# Patient Record
Sex: Female | Born: 1950
Health system: Southern US, Community
[De-identification: ages and names within clinical notes are randomized; demographics above are authoritative.]

## PROBLEM LIST (undated history)

## (undated) DIAGNOSIS — Z9889 Other specified postprocedural states: Secondary | ICD-10-CM

## (undated) DIAGNOSIS — M7662 Achilles tendinitis, left leg: Secondary | ICD-10-CM

## (undated) DIAGNOSIS — M797 Fibromyalgia: Secondary | ICD-10-CM

## (undated) DIAGNOSIS — E049 Nontoxic goiter, unspecified: Secondary | ICD-10-CM

## (undated) DIAGNOSIS — R112 Nausea with vomiting, unspecified: Secondary | ICD-10-CM

## (undated) HISTORY — PX: ABDOMINAL HYSTERECTOMY: SHX81

## (undated) HISTORY — PX: ACHILLES TENDON SURGERY: SHX542

---

## 1991-10-10 HISTORY — PX: BREAST EXCISIONAL BIOPSY: SUR124

## 2015-04-21 ENCOUNTER — Other Ambulatory Visit: Payer: Self-pay | Admitting: Orthopedic Surgery

## 2015-05-10 ENCOUNTER — Encounter (HOSPITAL_BASED_OUTPATIENT_CLINIC_OR_DEPARTMENT_OTHER): Payer: Self-pay | Admitting: *Deleted

## 2015-05-13 ENCOUNTER — Encounter (HOSPITAL_BASED_OUTPATIENT_CLINIC_OR_DEPARTMENT_OTHER)
Admission: RE | Disposition: A | Discharge status: Home / Self Care | Payer: Self-pay | Source: Ambulatory Visit | Attending: Orthopedic Surgery

## 2015-05-13 ENCOUNTER — Ambulatory Visit (HOSPITAL_BASED_OUTPATIENT_CLINIC_OR_DEPARTMENT_OTHER)
Admission: RE | Admit: 2015-05-13 | Discharge: 2015-05-13 | Disposition: A | Discharge status: Home / Self Care | Payer: Medicare Other | Source: Ambulatory Visit | Attending: Orthopedic Surgery | Admitting: Orthopedic Surgery

## 2015-05-13 ENCOUNTER — Ambulatory Visit (HOSPITAL_BASED_OUTPATIENT_CLINIC_OR_DEPARTMENT_OTHER): Payer: Medicare Other | Admitting: Certified Registered"

## 2015-05-13 ENCOUNTER — Encounter (HOSPITAL_BASED_OUTPATIENT_CLINIC_OR_DEPARTMENT_OTHER): Payer: Self-pay | Admitting: *Deleted

## 2015-05-13 DIAGNOSIS — M797 Fibromyalgia: Secondary | ICD-10-CM | POA: Diagnosis not present

## 2015-05-13 DIAGNOSIS — M7662 Achilles tendinitis, left leg: Secondary | ICD-10-CM | POA: Insufficient documentation

## 2015-05-13 DIAGNOSIS — Z87891 Personal history of nicotine dependence: Secondary | ICD-10-CM | POA: Insufficient documentation

## 2015-05-13 HISTORY — PX: GASTROCNEMIUS RECESSION: SHX863

## 2015-05-13 HISTORY — DX: Achilles tendinitis, left leg: M76.62

## 2015-05-13 HISTORY — DX: Nontoxic goiter, unspecified: E04.9

## 2015-05-13 HISTORY — DX: Nausea with vomiting, unspecified: R11.2

## 2015-05-13 HISTORY — DX: Other specified postprocedural states: Z98.890

## 2015-05-13 HISTORY — PX: EXCISION HAGLUND'S DEFORMITY WITH ACHILLES TENDON REPAIR: SHX5627

## 2015-05-13 HISTORY — DX: Fibromyalgia: M79.7

## 2015-05-13 LAB — POCT HEMOGLOBIN-HEMACUE: HEMOGLOBIN: 13.8 g/dL (ref 12.0–15.0)

## 2015-05-13 SURGERY — RECESSION, MUSCLE, GASTROCNEMIUS
Anesthesia: Regional | Site: Leg Lower | Laterality: Left

## 2015-05-13 MED ORDER — SUCCINYLCHOLINE CHLORIDE 20 MG/ML IJ SOLN
INTRAMUSCULAR | Status: AC
  Filled 2015-05-13: qty 1

## 2015-05-13 MED ORDER — FENTANYL CITRATE (PF) 100 MCG/2ML IJ SOLN
50.0000 ug | INTRAMUSCULAR | Status: DC | PRN
  Administered 2015-05-13: 100 ug via INTRAVENOUS

## 2015-05-13 MED ORDER — MIDAZOLAM HCL 2 MG/2ML IJ SOLN
INTRAMUSCULAR | Status: AC
  Filled 2015-05-13: qty 2

## 2015-05-13 MED ORDER — DEXAMETHASONE SODIUM PHOSPHATE 4 MG/ML IJ SOLN
INTRAMUSCULAR | Status: DC | PRN
  Administered 2015-05-13: 10 mg via INTRAVENOUS

## 2015-05-13 MED ORDER — SENNA 8.6 MG PO TABS: 2.0000 | ORAL_TABLET | Freq: Two times a day (BID) | ORAL | Status: DC

## 2015-05-13 MED ORDER — BUPIVACAINE-EPINEPHRINE (PF) 0.5% -1:200000 IJ SOLN
INTRAMUSCULAR | Status: DC | PRN
  Administered 2015-05-13 (×2): 10 mL via PERINEURAL

## 2015-05-13 MED ORDER — DOCUSATE SODIUM 100 MG PO CAPS: 100.0000 mg | ORAL_CAPSULE | Freq: Two times a day (BID) | ORAL | Status: DC

## 2015-05-13 MED ORDER — OXYCODONE HCL 5 MG PO TABS: 5.0000 mg | ORAL_TABLET | Freq: Once | ORAL | Status: DC | PRN

## 2015-05-13 MED ORDER — PROPOFOL 500 MG/50ML IV EMUL
INTRAVENOUS | Status: AC
  Filled 2015-05-13: qty 50

## 2015-05-13 MED ORDER — CEFAZOLIN SODIUM-DEXTROSE 2-3 GM-% IV SOLR
2.0000 g | INTRAVENOUS | Status: AC
  Administered 2015-05-13: 2 g via INTRAVENOUS

## 2015-05-13 MED ORDER — SUCCINYLCHOLINE CHLORIDE 20 MG/ML IJ SOLN
INTRAMUSCULAR | Status: DC | PRN
  Administered 2015-05-13: 100 mg via INTRAVENOUS

## 2015-05-13 MED ORDER — LACTATED RINGERS IV SOLN
INTRAVENOUS | Status: DC
  Administered 2015-05-13 (×2): via INTRAVENOUS

## 2015-05-13 MED ORDER — ASPIRIN EC 325 MG PO TBEC: 325.0000 mg | DELAYED_RELEASE_TABLET | Freq: Every day | ORAL | Status: DC

## 2015-05-13 MED ORDER — SCOPOLAMINE 1 MG/3DAYS TD PT72
1.0000 | MEDICATED_PATCH | Freq: Once | TRANSDERMAL | Status: DC | PRN
  Administered 2015-05-13: 1.5 mg via TRANSDERMAL

## 2015-05-13 MED ORDER — MIDAZOLAM HCL 2 MG/2ML IJ SOLN
1.0000 mg | INTRAMUSCULAR | Status: DC | PRN
  Administered 2015-05-13: 2 mg via INTRAVENOUS

## 2015-05-13 MED ORDER — FENTANYL CITRATE (PF) 100 MCG/2ML IJ SOLN
INTRAMUSCULAR | Status: AC
  Filled 2015-05-13: qty 6

## 2015-05-13 MED ORDER — OXYCODONE HCL 5 MG PO TABS: 5.0000 mg | ORAL_TABLET | ORAL | Status: DC | PRN

## 2015-05-13 MED ORDER — HYDROMORPHONE HCL 1 MG/ML IJ SOLN: 0.2500 mg | INTRAMUSCULAR | Status: DC | PRN

## 2015-05-13 MED ORDER — ROPIVACAINE HCL 5 MG/ML IJ SOLN
INTRAMUSCULAR | Status: DC | PRN
  Administered 2015-05-13 (×2): 10 mL via PERINEURAL

## 2015-05-13 MED ORDER — OXYCODONE HCL 5 MG/5ML PO SOLN: 5.0000 mg | Freq: Once | ORAL | Status: DC | PRN

## 2015-05-13 MED ORDER — ONDANSETRON HCL 4 MG/2ML IJ SOLN
INTRAMUSCULAR | Status: DC | PRN
  Administered 2015-05-13: 4 mg via INTRAVENOUS

## 2015-05-13 MED ORDER — PROPOFOL 10 MG/ML IV BOLUS
INTRAVENOUS | Status: DC | PRN
  Administered 2015-05-13: 200 mg via INTRAVENOUS

## 2015-05-13 MED ORDER — CEFAZOLIN SODIUM-DEXTROSE 2-3 GM-% IV SOLR
INTRAVENOUS | Status: AC
  Filled 2015-05-13: qty 50

## 2015-05-13 MED ORDER — GLYCOPYRROLATE 0.2 MG/ML IJ SOLN: 0.2000 mg | Freq: Once | INTRAMUSCULAR | Status: DC | PRN

## 2015-05-13 MED ORDER — LIDOCAINE HCL (CARDIAC) 20 MG/ML IV SOLN
INTRAVENOUS | Status: DC | PRN
  Administered 2015-05-13: 20 mg via INTRAVENOUS

## 2015-05-13 MED ORDER — SCOPOLAMINE 1 MG/3DAYS TD PT72
MEDICATED_PATCH | TRANSDERMAL | Status: AC
  Filled 2015-05-13: qty 1

## 2015-05-13 MED ORDER — FENTANYL CITRATE (PF) 100 MCG/2ML IJ SOLN
INTRAMUSCULAR | Status: AC
  Filled 2015-05-13: qty 2

## 2015-05-13 MED ORDER — MEPERIDINE HCL 25 MG/ML IJ SOLN: 6.2500 mg | INTRAMUSCULAR | Status: DC | PRN

## 2015-05-13 MED ORDER — 0.9 % SODIUM CHLORIDE (POUR BTL) OPTIME
TOPICAL | Status: DC | PRN
  Administered 2015-05-13: 250 mL

## 2015-05-13 MED ORDER — CHLORHEXIDINE GLUCONATE 4 % EX LIQD: 60.0000 mL | Freq: Once | CUTANEOUS | Status: DC

## 2015-05-13 MED ORDER — SODIUM CHLORIDE 0.9 % IV SOLN: INTRAVENOUS | Status: DC

## 2015-05-13 SURGICAL SUPPLY — 80 items
BANDAGE ESMARK 6X9 LF (GAUZE/BANDAGES/DRESSINGS) ×2 IMPLANT
BLADE AVERAGE 25MMX9MM (BLADE)
BLADE AVERAGE 25X9 (BLADE) IMPLANT
BLADE MICRO SAGITTAL (BLADE) ×4 IMPLANT
BLADE SURG 15 STRL LF DISP TIS (BLADE) ×4 IMPLANT
BLADE SURG 15 STRL SS (BLADE) ×4
BNDG COHESIVE 4X5 TAN STRL (GAUZE/BANDAGES/DRESSINGS) ×4 IMPLANT
BNDG COHESIVE 6X5 TAN STRL LF (GAUZE/BANDAGES/DRESSINGS) ×4 IMPLANT
BNDG ESMARK 6X9 LF (GAUZE/BANDAGES/DRESSINGS) ×4
BOOT STEPPER DURA LG (SOFTGOODS) IMPLANT
BOOT STEPPER DURA MED (SOFTGOODS) IMPLANT
CANISTER SUCT 1200ML W/VALVE (MISCELLANEOUS) IMPLANT
CHLORAPREP W/TINT 26ML (MISCELLANEOUS) ×4 IMPLANT
COVER BACK TABLE 60X90IN (DRAPES) ×4 IMPLANT
CUFF TOURNIQUET SINGLE 34IN LL (TOURNIQUET CUFF) ×4 IMPLANT
DRAPE EXTREMITY T 121X128X90 (DRAPE) ×4 IMPLANT
DRAPE OEC MINIVIEW 54X84 (DRAPES) IMPLANT
DRAPE U-SHAPE 47X51 STRL (DRAPES) ×4 IMPLANT
DRSG EMULSION OIL 3X3 NADH (GAUZE/BANDAGES/DRESSINGS) IMPLANT
DRSG MEPITEL 4X7.2 (GAUZE/BANDAGES/DRESSINGS) ×4 IMPLANT
DRSG PAD ABDOMINAL 8X10 ST (GAUZE/BANDAGES/DRESSINGS) ×8 IMPLANT
ELECT REM PT RETURN 9FT ADLT (ELECTROSURGICAL) ×4
ELECTRODE REM PT RTRN 9FT ADLT (ELECTROSURGICAL) ×2 IMPLANT
GAUZE SPONGE 4X4 12PLY STRL (GAUZE/BANDAGES/DRESSINGS) ×4 IMPLANT
GLOVE BIO SURGEON STRL SZ8 (GLOVE) ×4 IMPLANT
GLOVE BIOGEL PI IND STRL 7.0 (GLOVE) ×2 IMPLANT
GLOVE BIOGEL PI IND STRL 8 (GLOVE) ×4 IMPLANT
GLOVE BIOGEL PI INDICATOR 7.0 (GLOVE) ×2
GLOVE BIOGEL PI INDICATOR 8 (GLOVE) ×4
GLOVE ECLIPSE 6.5 STRL STRAW (GLOVE) ×4 IMPLANT
GLOVE ECLIPSE 7.5 STRL STRAW (GLOVE) ×4 IMPLANT
GLOVE EXAM NITRILE MD LF STRL (GLOVE) ×4 IMPLANT
GOWN STRL REUS W/ TWL LRG LVL3 (GOWN DISPOSABLE) ×2 IMPLANT
GOWN STRL REUS W/ TWL XL LVL3 (GOWN DISPOSABLE) ×4 IMPLANT
GOWN STRL REUS W/TWL LRG LVL3 (GOWN DISPOSABLE) ×2
GOWN STRL REUS W/TWL XL LVL3 (GOWN DISPOSABLE) ×4
KIT BIO-TENODESIS 3X8 DISP (MISCELLANEOUS)
KIT INSRT BABSR STRL DISP BTN (MISCELLANEOUS) IMPLANT
NDL SAFETY ECLIPSE 18X1.5 (NEEDLE) IMPLANT
NEEDLE HYPO 18GX1.5 SHARP (NEEDLE)
NEEDLE HYPO 22GX1.5 SAFETY (NEEDLE) IMPLANT
NEEDLE HYPO 25X1 1.5 SAFETY (NEEDLE) ×4 IMPLANT
NS IRRIG 1000ML POUR BTL (IV SOLUTION) ×4 IMPLANT
PACK ACHILLES SUTUREBRIDGE (Anchor) ×4 IMPLANT
PACK BASIN DAY SURGERY FS (CUSTOM PROCEDURE TRAY) ×4 IMPLANT
PAD CAST 4YDX4 CTTN HI CHSV (CAST SUPPLIES) ×2 IMPLANT
PADDING CAST ABS 4INX4YD NS (CAST SUPPLIES) ×2
PADDING CAST ABS COTTON 4X4 ST (CAST SUPPLIES) ×2 IMPLANT
PADDING CAST COTTON 4X4 STRL (CAST SUPPLIES) ×2
PADDING CAST COTTON 6X4 STRL (CAST SUPPLIES) ×4 IMPLANT
PENCIL BUTTON HOLSTER BLD 10FT (ELECTRODE) ×4 IMPLANT
SANITIZER HAND PURELL 535ML FO (MISCELLANEOUS) ×4 IMPLANT
SHEET MEDIUM DRAPE 40X70 STRL (DRAPES) ×4 IMPLANT
SLEEVE SCD COMPRESS KNEE MED (MISCELLANEOUS) ×4 IMPLANT
SPLINT FAST PLASTER 5X30 (CAST SUPPLIES) ×40
SPLINT PLASTER CAST FAST 5X30 (CAST SUPPLIES) ×40 IMPLANT
SPONGE LAP 18X18 X RAY DECT (DISPOSABLE) ×4 IMPLANT
STAPLER VISISTAT 35W (STAPLE) IMPLANT
STOCKINETTE 6  STRL (DRAPES) ×2
STOCKINETTE 6 STRL (DRAPES) ×2 IMPLANT
SUCTION FRAZIER TIP 10 FR DISP (SUCTIONS) ×4 IMPLANT
SUT ETHIBOND 2 OS 4 DA (SUTURE) IMPLANT
SUT ETHIBOND 3-0 V-5 (SUTURE) IMPLANT
SUT ETHILON 3 0 PS 1 (SUTURE) ×8 IMPLANT
SUT FIBERWIRE #2 38 T-5 BLUE (SUTURE)
SUT MNCRL AB 3-0 PS2 18 (SUTURE) ×8 IMPLANT
SUT VIC AB 0 CT1 27 (SUTURE)
SUT VIC AB 0 CT1 27XBRD ANBCTR (SUTURE) IMPLANT
SUT VIC AB 0 SH 27 (SUTURE) IMPLANT
SUT VIC AB 2-0 SH 18 (SUTURE) IMPLANT
SUT VIC AB 2-0 SH 27 (SUTURE) ×2
SUT VIC AB 2-0 SH 27XBRD (SUTURE) ×2 IMPLANT
SUTURE FIBERWR #2 38 T-5 BLUE (SUTURE) IMPLANT
SYR BULB 3OZ (MISCELLANEOUS) ×4 IMPLANT
SYR CONTROL 10ML LL (SYRINGE) ×4 IMPLANT
TOWEL OR 17X24 6PK STRL BLUE (TOWEL DISPOSABLE) ×8 IMPLANT
TUBE CONNECTING 20'X1/4 (TUBING) ×1
TUBE CONNECTING 20X1/4 (TUBING) ×3 IMPLANT
UNDERPAD 30X30 (UNDERPADS AND DIAPERS) ×4 IMPLANT
YANKAUER SUCT BULB TIP NO VENT (SUCTIONS) IMPLANT

## 2015-05-13 NOTE — Op Note (Signed)
NAMECORRISSA, MARTELLO            ACCOUNT NO.:  [id number]  MEDICAL RECORD NO.:  [id number]  LOCATION:                                 FACILITY:  PHYSICIAN:  Toni Arthurs, MD             DATE OF BIRTH:  DATE OF PROCEDURE:  05/13/2015 DATE OF DISCHARGE:                              OPERATIVE REPORT   PREOPERATIVE DIAGNOSES: 1. Left Achilles chronic insertional tendinopathy. 2. Left tight heel cord. 3. Left Haglund deformity.  POSTOPERATIVE DIAGNOSES: 1. Left Achilles chronic insertional tendinopathy. 2. Left tight heel cord. 3. Left Haglund deformity.  PROCEDURE: 1. Left gastrocnemius recession through a separate incision. 2. Left Achilles tendon debridement and reconstruction. 3. Excision of left Haglund deformity.  SURGEON:  Toni Arthurs, MD  ASSISTANT:  Alfredo Martinez, PA-C  ANESTHESIA:  General, regional.  ESTIMATED BLOOD LOSS:  Minimal.  TOURNIQUET TIME:  45 minutes at 250 mmHg.  COMPLICATIONS:  None apparent.  DISPOSITION:  Extubated, awake and stable to recovery.  INDICATIONS FOR PROCEDURE:  The patient is a 64 year old woman with a long history of left heel pain.  She has failed nonoperative treatment to date including activity modification, oral anti-inflammatories, shoe wear modification, and physical therapy.  MRI shows chronic Achilles insertional tendinopathy.  She presents now for operative treatment of this painful condition.  She understands the risks and benefits, the alternative treatment options, and elects surgical treatment.  She specifically understands risks of bleeding, infection, nerve damage, blood clots, need for additional surgery, continued pain, recurrence of the deformity, amputation, and death.  PROCEDURE IN DETAIL:  After preoperative consent was obtained and the correct operative site was identified, the patient was brought to the operating room and placed supine on the stretcher.  General anesthesia was induced.  Preoperative  antibiotics were administered.  Surgical time- out was taken.  Left lower extremity was exsanguinated and a thigh tourniquet was inflated to 250 mmHg.  The patient was then turned into the prone position on the operating table with all bony prominences padded well.  Left lower extremity was prepped and draped in standard sterile fashion.  A longitudinal incision was then made over the gastrocnemius tendon.  Sharp dissection was carried down through the skin, subcutaneous tissue.  Care was taken to protect the sural nerve and lesser saphenous vein.  Superficial fascia was incised.  The gastrocnemius tendon was identified.  It was divided under direct vision in it's entirety.  Wound was irrigated and closed with Monocryl and nylon.  Attention was then turned to the posterior aspect of the heel where longitudinal incision was made.  Sharp dissection was carried down through the skin and subcutaneous tissue and paratenon, creating full- thickness flaps medially and laterally.  The Achilles tendon was then incised longitudinally and elevated from it's insertion on the calcaneus medially and laterally.  The prominent Haglund deformity as well as insertional enthesophyte were identified.  They were resected with the oscillating saw leaving to help the cut surface of bone.  The wound was irrigated.  The Achilles tendon was then debrided of all degenerative and fibrous tissue leaving only healthy-appearing tendon.  The Achilles tendon was then reconstructed by repairing  it to the cut surface of bone using the Arthrex suture bridge construct of 4-suture anchors and FiberWire suture.  The longitudinal split was then repaired with horizontal mattress sutures of 2-0 Vicryl.  The paratenon subcutaneous tissues were approximated with inverted simple sutures of 3-0 Monocryl. The skin incision was closed with a running 3-0 nylon.  Sterile dressings were applied followed by a well-padded short-leg  splint. Tourniquet was released after application of the dressings at 45 minutes.  The patient was awakened from anesthesia and transported to the recovery room in stable condition.  FOLLOWUP PLAN:  The patient will be nonweightbearing on the left lower extremity.  She will follow up with me in 2 weeks for suture removal and conversion to a CAM walker boot with a heel lift.  She will start aspirin for DVT prophylaxis.  Alfredo Martinez, PA-C, was present and scrubbed for the duration of the case.  His assistance was essential in positioning the patient, prepping and draping, gaining and maintaining exposure, performing the operation, closing and dressing the wounds, and applying the splint.     Toni Arthurs, MD     JH/MEDQ  D:  05/13/2015  T:  05/13/2015  Job:  [id number]

## 2015-05-13 NOTE — H&P (Signed)
Megan Mendez is an 64 y.o. female.   Chief Complaint: left heel pain HPI: 64 y/o female with left achilles tendonopathy, Haglund deformity and tight heelcord.  She has failed non op treatment and presents now for surgical correction.  Past Medical History  Diagnosis Date  . Fibromyalgia   . PONV (postoperative nausea and vomiting)   . Goiter, nodular   . Achilles tendinitis of left lower extremity     Past Surgical History  Procedure Laterality Date  . Abdominal hysterectomy    . Achilles tendon surgery Right     History reviewed. No pertinent family history. Social History:  reports that she has quit smoking. Her smoking use included Cigarettes. She does not have any smokeless tobacco history on file. She reports that she does not drink alcohol or use illicit drugs.  Allergies: No Known Allergies  Medications Prior to Admission  Medication Sig Dispense Refill  . calcium carbonate (OS-CAL) 600 MG TABS tablet Take 600 mg by mouth 2 (two) times daily with a meal.    . cetirizine (ZYRTEC) 10 MG tablet Take 10 mg by mouth daily.    . cyanocobalamin 500 MCG tablet Take 500 mcg by mouth daily.    . Melatonin 10 MG TABS Take 10 mg by mouth.    . metroNIDAZOLE (METROGEL) 0.75 % gel Apply 1 application topically 2 (two) times daily.    . Multiple Vitamin (MULTIVITAMIN WITH MINERALS) TABS tablet Take 1 tablet by mouth daily.      No results found for this or any previous visit (from the past 48 hour(s)). No results found.  ROS  No recent f/c/n/v/wt loss  Height  (1.626 m), weight 80.4 kg (177 lb 4 oz). Physical Exam  wn wd woman in nad.  A and O x 4.  Mood and affect normal.  EOMI.  resp unlabored.  L heel ttp at insertion of achilles.  No lymphadenopathy.  Skin o/w healthy and intact.  Sens to LT intact at the foot. Pulses palpable.  Assessment/Plan L achilles tendonitis, haglund deformity, tight heelcord - to OR for excision of Haglund deformity, reconstruction of  achilles tendon and gastroc recession.  The risks and benefits of the alternative treatment options have been discussed in detail.  The patient wishes to proceed with surgery and specifically understands risks of bleeding, infection, nerve damage, blood clots, need for additional surgery, amputation and death.   Toni Arthurs 06/10/15, 7:26 AM

## 2015-05-13 NOTE — Progress Notes (Signed)
Assisted Dr. Crews with left, ultrasound guided, popliteal/saphenous block. Side rails up, monitors on throughout procedure. See vital signs in flow sheet. Tolerated Procedure well. 

## 2015-05-13 NOTE — Brief Op Note (Signed)
05/13/2015  9:35 AM  PATIENT:  Megan Mendez  64 y.o. female  PRE-OPERATIVE DIAGNOSIS: 1.  Left achilles chronic insertional tendonopathy      2.  Left tight heelcord      3.  Left Haglund deformity  POST-OPERATIVE DIAGNOSIS: same  Procedure(s): 1.  Left Gastrocnemius Recession (separate incision) 2.  Left Achilles Debridement and Reconstruction 3.  Left Haglund Excision  SURGEON:  Toni Arthurs, MD  ASSISTANT:  Alfredo Martinez, PA-C  ANESTHESIA:   General, regional  EBL:  minimal   TOURNIQUET:   Total Tourniquet Time Documented: Thigh (Left) - 45 minutes Total: Thigh (Left) - 45 minutes  COMPLICATIONS:  None apparent  DISPOSITION:  Extubated, awake and stable to recovery.  DICTATION ID:  [id number]

## 2015-05-13 NOTE — Transfer of Care (Signed)
Immediate Anesthesia Transfer of Care Note  Patient: Megan Mendez  Procedure(s) Performed: Procedure(s): Left Gastrocnemius Recession (Left) Achilles Debridement and Reconstruction; Left Haglund Excision (Left)  Patient Location: PACU  Anesthesia Type:GA combined with regional for post-op pain  Level of Consciousness: awake, alert , oriented and patient cooperative  Airway & Oxygen Therapy: Patient Spontanous Breathing and Patient connected to face mask oxygen  Post-op Assessment: Report given to RN and Post -op Vital signs reviewed and stable  Post vital signs: Reviewed and stable  Last Vitals:  Filed Vitals:   05/13/15 0800  Temp: 36.7 C  Resp: 12    Complications: No apparent anesthesia complications

## 2015-05-13 NOTE — Anesthesia Procedure Notes (Addendum)
Anesthesia Regional Block:  Popliteal block  Pre-Anesthetic Checklist: ,, timeout performed, Correct Patient, Correct Site, Correct Laterality, Correct Procedure, Correct Position, site marked, Risks and benefits discussed,  Surgical consent,  Pre-op evaluation,  At surgeon's request and post-op pain management  Laterality: Left and Lower  Prep: chloraprep       Needles:  Injection technique: Single-shot  Needle Type: Echogenic Needle     Needle Length: 9cm 9 cm Needle Gauge: 21 and 21 G    Additional Needles:  Procedures: ultrasound guided (picture in chart) Popliteal block Narrative:  Start time: 05/13/2015 8:02 AM Injection made incrementally with aspirations every 5 mL.  Performed by: Personally  Anesthesiologist: CREWS, DAVID   Anesthesia Regional Block:  Adductor canal block  Pre-Anesthetic Checklist: ,, timeout performed, Correct Patient, Correct Site, Correct Laterality, Correct Procedure, Correct Position, site marked, Risks and benefits discussed,  Surgical consent,  Pre-op evaluation,  At surgeon's request and post-op pain management  Laterality: Left and Lower  Prep: chloraprep       Needles:  Injection technique: Single-shot  Needle Type: Echogenic Needle     Needle Length: 9cm 9 cm Needle Gauge: 21 and 21 G    Additional Needles:  Procedures: ultrasound guided (picture in chart) Adductor canal block Narrative:  Start time: 05/13/2015 8:03 AM End time: 05/13/2015 8:08 AM Injection made incrementally with aspirations every 5 mL.  Performed by: Personally  Anesthesiologist: CREWS, DAVID   Procedure Name: Intubation Date/Time: 05/13/2015 8:26 AM Performed by: Anahi Belmar D Pre-anesthesia Checklist: Patient identified, Emergency Drugs available, Suction available and Patient being monitored Patient Re-evaluated:Patient Re-evaluated prior to inductionOxygen Delivery Method: Circle System Utilized Preoxygenation: Pre-oxygenation with 100%  oxygen Intubation Type: IV induction Ventilation: Mask ventilation without difficulty Laryngoscope Size: Mac and 3 Grade View: Grade I Tube type: Oral Number of attempts: 1 Airway Equipment and Method: Stylet and Oral airway Placement Confirmation: ETT inserted through vocal cords under direct vision,  positive ETCO2 and breath sounds checked- equal and bilateral Secured at: 21 cm Tube secured with: Tape Dental Injury: Teeth and Oropharynx as per pre-operative assessment

## 2015-05-13 NOTE — Discharge Instructions (Addendum)
Megan Hewitt, MD °[facility] ° °Please read the following information regarding your care after surgery. ° °Medications  °You only need a prescription for the narcotic pain medicine (ex. oxycodone, Percocet, Norco).  All of the other medicines listed below are available over the counter. °X acetominophen (Tylenol) 650 mg every 4-6 hours as you need for minor pain °X oxycodone as prescribed for moderate to severe pain °?  ° °Narcotic pain medicine (ex. oxycodone, Percocet, Vicodin) will cause constipation.  To prevent this problem, take the following medicines while you are taking any pain medicine. °X docusate sodium (Colace) 100 mg twice a day X senna (Senokot) 2 tablets twice a day ° °X To help prevent blood clots, take an aspirin (325 mg) once a day for a month after surgery.  You should also get up every hour while you are awake to move around.   ° °Weight Bearing °X Do not bear any weight on the operated leg or foot. ° °Cast / Splint / Dressing °X Keep your splint or cast clean and dry.  Don’t put anything (coat hanger, pencil, etc) down inside of it.  If it gets damp, use a hair dryer on the cool setting to dry it.  If it gets soaked, call the office to schedule an appointment for a cast change. ° ° °After your dressing, cast or splint is removed; you may shower, but do not soak or scrub the wound.  Allow the water to run over it, and then gently pat it dry. ° °Swelling °It is normal for you to have swelling where you had surgery.  To reduce swelling and pain, keep your toes above your nose for at least 3 days after surgery.  It may be necessary to keep your foot or leg elevated for several weeks.  If it hurts, it should be elevated. ° °Follow Up °Call my office at [phone] when you are discharged from the hospital or surgery center to schedule an appointment to be seen two weeks after surgery. ° °Call my office at [phone] if you develop a fever >101.5° F, nausea, vomiting, bleeding from  the surgical site or severe pain.   ° °Regional Anesthesia Blocks ° °1. Numbness or the inability to move the "blocked" extremity may last from 3-48 hours after placement. The length of time depends on the medication injected and your individual response to the medication. If the numbness is not going away after 48 hours, call your surgeon. ° °2. The extremity that is blocked will need to be protected until the numbness is gone and the  Strength has returned. Because you cannot feel it, you will need to take extra care to avoid injury. Because it may be weak, you may have difficulty moving it or using it. You may not know what position it is in without looking at it while the block is in effect. ° °3. For blocks in the legs and feet, returning to weight bearing and walking needs to be done carefully. You will need to wait until the numbness is entirely gone and the strength has returned. You should be able to move your leg and foot normally before you try and bear weight or walk. You will need someone to be with you when you first try to ensure you do not fall and possibly risk injury. ° °4. Bruising and tenderness at the needle site are common side effects and will resolve in a few days. ° °5. Persistent numbness or new problems with movement   should be communicated to the surgeon or the [facility] ([phone])/ [facility] ([phone]). ° °Post Anesthesia Home Care Instructions ° °Activity: °Get plenty of rest for the remainder of the day. A responsible adult should stay with you for 24 hours following the procedure.  °For the next 24 hours, DO NOT: °-Drive a car °-Operate machinery °-Drink alcoholic beverages °-Take any medication unless instructed by your physician °-Make any legal decisions or sign important papers. ° °Meals: °Start with liquid foods such as gelatin or soup. Progress to regular foods as tolerated. Avoid greasy, spicy, heavy foods. If nausea and/or vomiting occur,  drink only clear liquids until the nausea and/or vomiting subsides. Call your physician if vomiting continues. ° °Special Instructions/Symptoms: °Your throat may feel dry or sore from the anesthesia or the breathing tube placed in your throat during surgery. If this causes discomfort, gargle with warm salt water. The discomfort should disappear within 24 hours. ° °If you had a scopolamine patch placed behind your ear for the management of post- operative nausea and/or vomiting: ° °1. The medication in the patch is effective for 72 hours, after which it should be removed.  Wrap patch in a tissue and discard in the trash. Wash hands thoroughly with soap and water. °2. You may remove the patch earlier than 72 hours if you experience unpleasant side effects which may include dry mouth, dizziness or visual disturbances. °3. Avoid touching the patch. Wash your hands with soap and water after contact with the patch. °  ° °

## 2015-05-13 NOTE — Anesthesia Postprocedure Evaluation (Signed)
  Anesthesia Post-op Note  Patient: Megan Mendez  Procedure(s) Performed: Procedure(s): Left Gastrocnemius Recession (Left) Achilles Debridement and Reconstruction; Left Haglund Excision (Left)  Patient Location: PACU  Anesthesia Type: General, Regional   Level of Consciousness: awake, alert  and oriented  Airway and Oxygen Therapy: Patient Spontanous Breathing  Post-op Pain: none  Post-op Assessment: Post-op Vital signs reviewed  Post-op Vital Signs: Reviewed  Last Vitals:  Filed Vitals:   05/13/15 1042  BP: 116/49  Pulse: 72  Temp: 36.7 C  Resp: 16    Complications: No apparent anesthesia complications

## 2015-05-13 NOTE — Anesthesia Preprocedure Evaluation (Addendum)
Anesthesia Evaluation  Patient identified by MRN, date of birth, ID band Patient awake    Reviewed: Allergy & Precautions, NPO status   History of Anesthesia Complications (+) PONV  Airway Mallampati: I  TM Distance: >3 FB Neck ROM: Full    Dental  (+) Teeth Intact, Dental Advisory Given   Pulmonary former smoker,  breath sounds clear to auscultation        Cardiovascular Rhythm:Regular Rate:Normal     Neuro/Psych    GI/Hepatic   Endo/Other    Renal/GU      Musculoskeletal   Abdominal   Peds  Hematology   Anesthesia Other Findings   Reproductive/Obstetrics                            Anesthesia Physical Anesthesia Plan  ASA: I  Anesthesia Plan: General and Regional   Post-op Pain Management:    Induction: Intravenous  Airway Management Planned: Oral ETT  Additional Equipment:   Intra-op Plan:   Post-operative Plan: Extubation in OR  Informed Consent: I have reviewed the patients History and Physical, chart, labs and discussed the procedure including the risks, benefits and alternatives for the proposed anesthesia with the patient or authorized representative who has indicated his/her understanding and acceptance.   Dental advisory given  Plan Discussed with: CRNA, Anesthesiologist and Surgeon  Anesthesia Plan Comments:        Anesthesia Quick Evaluation

## 2015-05-14 ENCOUNTER — Encounter (HOSPITAL_BASED_OUTPATIENT_CLINIC_OR_DEPARTMENT_OTHER): Payer: Self-pay | Admitting: Orthopedic Surgery

## 2017-07-09 ENCOUNTER — Other Ambulatory Visit: Payer: Self-pay | Admitting: Family Medicine

## 2017-07-09 DIAGNOSIS — Z1231 Encounter for screening mammogram for malignant neoplasm of breast: Secondary | ICD-10-CM

## 2017-07-13 ENCOUNTER — Ambulatory Visit
Admission: RE | Admit: 2017-07-13 | Discharge: 2017-07-13 | Disposition: A | Discharge status: Home / Self Care | Payer: Medicare HMO | Source: Ambulatory Visit | Attending: Family Medicine | Admitting: Family Medicine

## 2017-07-13 DIAGNOSIS — Z1231 Encounter for screening mammogram for malignant neoplasm of breast: Secondary | ICD-10-CM

## 2018-06-05 ENCOUNTER — Other Ambulatory Visit: Payer: Self-pay | Admitting: Family Medicine

## 2018-06-05 DIAGNOSIS — Z1231 Encounter for screening mammogram for malignant neoplasm of breast: Secondary | ICD-10-CM

## 2018-07-24 ENCOUNTER — Ambulatory Visit
Admission: RE | Admit: 2018-07-24 | Discharge: 2018-07-24 | Disposition: A | Discharge status: Home / Self Care | Payer: Medicare HMO | Source: Ambulatory Visit | Attending: Family Medicine | Admitting: Family Medicine

## 2018-07-24 DIAGNOSIS — Z1231 Encounter for screening mammogram for malignant neoplasm of breast: Secondary | ICD-10-CM

## 2019-08-11 ENCOUNTER — Ambulatory Visit
Admission: RE | Admit: 2019-08-11 | Discharge: 2019-08-11 | Disposition: A | Discharge status: Home / Self Care | Payer: Medicare HMO | Source: Ambulatory Visit | Attending: Family Medicine | Admitting: Family Medicine

## 2019-08-11 ENCOUNTER — Other Ambulatory Visit: Payer: Self-pay | Admitting: Family Medicine

## 2019-08-11 DIAGNOSIS — Z77120 Contact with and (suspected) exposure to mold (toxic): Secondary | ICD-10-CM

## 2019-09-15 MED FILL — GABAPENTIN 100 MG CAPSULE: 100 | 90 days supply | Qty: 90 | Fill #0

## 2020-01-19 ENCOUNTER — Ambulatory Visit: Payer: Medicare HMO | Attending: Internal Medicine

## 2020-01-19 DIAGNOSIS — Z23 Encounter for immunization: Secondary | ICD-10-CM

## 2020-01-19 NOTE — Progress Notes (Signed)
   Covid-19 Vaccination Clinic  Name:  Megan Mendez    MRN: [id number] DOB: [birth year]  01/19/2020  [name] was observed post Covid-19 immunization for 15 minutes without incident. She was provided with Vaccine Information Sheet and instruction to access the V-Safe system.   [name] was instructed to call 911 with any severe reactions post vaccine: Marland Kitchen Difficulty breathing  . Swelling of face and throat  . A fast heartbeat  . A bad rash all over body  . Dizziness and weakness   Immunizations Administered    Name Date Dose VIS Date Route   Pfizer COVID-19 Vaccine 01/19/2020 12:11 PM 0.3 mL 09/19/2019 Intramuscular   Manufacturer: [employer], [employer]   Lot: [id number]   NDC: [address]-6

## 2020-02-10 ENCOUNTER — Ambulatory Visit: Payer: Medicare HMO | Attending: Internal Medicine

## 2020-02-10 ENCOUNTER — Other Ambulatory Visit (HOSPITAL_COMMUNITY)
Admission: RE | Admit: 2020-02-10 | Discharge: 2020-02-10 | Disposition: A | Discharge status: Home / Self Care | Payer: Medicare HMO | Source: Ambulatory Visit | Attending: Family Medicine | Admitting: Family Medicine

## 2020-02-10 DIAGNOSIS — Z01812 Encounter for preprocedural laboratory examination: Secondary | ICD-10-CM | POA: Diagnosis present

## 2020-02-10 DIAGNOSIS — Z23 Encounter for immunization: Secondary | ICD-10-CM

## 2020-02-10 DIAGNOSIS — Z20822 Contact with and (suspected) exposure to covid-19: Secondary | ICD-10-CM | POA: Diagnosis not present

## 2020-02-10 LAB — SARS CORONAVIRUS 2 (TAT 6-24 HRS): SARS Coronavirus 2: NEGATIVE

## 2020-02-10 NOTE — Progress Notes (Signed)
   Covid-19 Vaccination Clinic  Name:  Megan Mendez    MRN: [id number] DOB: June 06, 1951  02/10/2020  Megan Mendez was observed post Covid-19 immunization for 15 minutes without incident. She was provided with Vaccine Information Sheet and instruction to access the V-Safe system.   Megan Mendez was instructed to call 911 with any severe reactions post vaccine: Marland Kitchen Difficulty breathing  . Swelling of face and throat  . A fast heartbeat  . A bad rash all over body  . Dizziness and weakness   Immunizations Administered    Name Date Dose VIS Date Route   Pfizer COVID-19 Vaccine 02/10/2020  9:33 AM 0.3 mL 12/03/2018 Intramuscular   Manufacturer: [employer], [employer]   Lot: [id number]   NDC: [address]-1

## 2020-02-12 ENCOUNTER — Other Ambulatory Visit: Payer: Self-pay | Admitting: Family Medicine

## 2020-02-12 DIAGNOSIS — J449 Chronic obstructive pulmonary disease, unspecified: Secondary | ICD-10-CM

## 2020-02-13 ENCOUNTER — Ambulatory Visit (INDEPENDENT_AMBULATORY_CARE_PROVIDER_SITE_OTHER): Payer: Medicare HMO | Admitting: Internal Medicine

## 2020-02-13 ENCOUNTER — Other Ambulatory Visit: Payer: Self-pay

## 2020-02-13 DIAGNOSIS — J449 Chronic obstructive pulmonary disease, unspecified: Secondary | ICD-10-CM

## 2020-02-13 LAB — PULMONARY FUNCTION TEST
DL/VA % pred: 133 %
DL/VA: 5.54 ml/min/mmHg/L
DLCO cor % pred: 152 %
DLCO cor: 29.61 ml/min/mmHg
DLCO unc % pred: 152 %
DLCO unc: 29.61 ml/min/mmHg
FEF 25-75 Post: 2.8 L/sec
FEF 25-75 Pre: 2.75 L/sec
FEF2575-%Change-Post: 1 %
FEF2575-%Pred-Post: 147 %
FEF2575-%Pred-Pre: 144 %
FEV1-%Change-Post: 0 %
FEV1-%Pred-Post: 118 %
FEV1-%Pred-Pre: 117 %
FEV1-Post: 2.69 L
FEV1-Pre: 2.67 L
FEV1FVC-%Change-Post: 4 %
FEV1FVC-%Pred-Pre: 105 %
FEV6-%Change-Post: -3 %
FEV6-%Pred-Post: 111 %
FEV6-%Pred-Pre: 115 %
FEV6-Post: 3.19 L
FEV6-Pre: 3.3 L
FEV6FVC-%Pred-Post: 104 %
FEV6FVC-%Pred-Pre: 104 %
FVC-%Change-Post: -3 %
FVC-%Pred-Post: 106 %
FVC-%Pred-Pre: 110 %
FVC-Post: 3.19 L
FVC-Pre: 3.32 L
Post FEV1/FVC ratio: 84 %
Post FEV6/FVC ratio: 100 %
Pre FEV1/FVC ratio: 80 %
Pre FEV6/FVC Ratio: 100 %
RV % pred: 108 %
RV: 2.38 L
TLC % pred: 113 %
TLC: 5.73 L

## 2020-02-13 NOTE — Progress Notes (Signed)
Full PFT performed today. °

## 2020-03-02 ENCOUNTER — Telehealth: Payer: Self-pay | Admitting: Internal Medicine

## 2020-03-02 NOTE — Telephone Encounter (Signed)
The PFT is marked for the wrong physician. It lists referring as Dr Ricka Burdock. The correct physician is Dr Windle Guard. The PFT showed normal airflow and lung volumes. The Diffusion was increased, which can be seen with polycythemia and high output congestive heart failure. Dr Jeannetta Nap may like to have this comment, and the interpreted PFT report, faxed to him. I would be happy to discuss.

## 2020-03-02 NOTE — Telephone Encounter (Signed)
Dr. Maple Hudson, please advise on this request.

## 2020-03-04 NOTE — Telephone Encounter (Signed)
Called [facility] back to let them know that I would be faxing over PFT results as well as message from Dr. Maple Hudson. Paperwork was sent to [phone]. Received confirmation. Nothing further needed at this time.

## 2020-06-08 ENCOUNTER — Ambulatory Visit: Payer: Medicare HMO

## 2020-06-08 ENCOUNTER — Ambulatory Visit: Payer: Self-pay

## 2020-06-08 ENCOUNTER — Other Ambulatory Visit: Payer: Self-pay

## 2020-06-08 DIAGNOSIS — Z23 Encounter for immunization: Secondary | ICD-10-CM

## 2020-07-07 ENCOUNTER — Other Ambulatory Visit: Payer: Self-pay

## 2020-07-07 ENCOUNTER — Other Ambulatory Visit (INDEPENDENT_AMBULATORY_CARE_PROVIDER_SITE_OTHER): Payer: Medicare HMO

## 2020-07-07 DIAGNOSIS — Z1152 Encounter for screening for COVID-19: Secondary | ICD-10-CM

## 2020-07-07 LAB — POC COVID19 BINAXNOW: SARS Coronavirus 2 Ag: NEGATIVE

## 2020-07-07 NOTE — Progress Notes (Signed)
I am not aware of this patient but her Covid test is negative.

## 2020-07-08 LAB — SARS-COV-2, NAA 2 DAY TAT

## 2020-07-08 LAB — NOVEL CORONAVIRUS, NAA: SARS-CoV-2, NAA: NOT DETECTED

## 2020-07-08 NOTE — Progress Notes (Signed)
Her Covid PCR is negative

## 2020-08-06 ENCOUNTER — Other Ambulatory Visit (HOSPITAL_COMMUNITY): Payer: Self-pay | Admitting: Nurse Practitioner

## 2020-08-06 MED FILL — ESTRADIOL 0.1 MG/GM CREA: 0.1 | 90 days supply | Qty: 43 | Fill #0

## 2020-08-06 MED FILL — traZODone HCL 50 MG TABS: 50 | 50 days supply | Qty: 100 | Fill #0

## 2020-10-05 ENCOUNTER — Other Ambulatory Visit: Payer: Self-pay | Admitting: Nurse Practitioner

## 2020-10-05 DIAGNOSIS — Z1231 Encounter for screening mammogram for malignant neoplasm of breast: Secondary | ICD-10-CM

## 2020-10-05 DIAGNOSIS — E2839 Other primary ovarian failure: Secondary | ICD-10-CM

## 2020-11-22 ENCOUNTER — Other Ambulatory Visit (HOSPITAL_COMMUNITY): Payer: Self-pay | Admitting: Nurse Practitioner

## 2020-11-22 MED FILL — SERTRALINE HCL 50 MG TABLET: 50 | 90 days supply | Qty: 90 | Fill #0

## 2020-11-23 ENCOUNTER — Ambulatory Visit
Admission: RE | Admit: 2020-11-23 | Discharge: 2020-11-23 | Disposition: A | Discharge status: Home / Self Care | Payer: Medicare HMO | Source: Ambulatory Visit | Attending: Nurse Practitioner | Admitting: Nurse Practitioner

## 2020-11-23 ENCOUNTER — Other Ambulatory Visit: Payer: Self-pay

## 2020-11-23 DIAGNOSIS — Z1231 Encounter for screening mammogram for malignant neoplasm of breast: Secondary | ICD-10-CM

## 2021-01-18 ENCOUNTER — Other Ambulatory Visit: Payer: Self-pay

## 2021-01-18 ENCOUNTER — Ambulatory Visit
Admission: RE | Admit: 2021-01-18 | Discharge: 2021-01-18 | Disposition: A | Discharge status: Home / Self Care | Payer: Medicare HMO | Source: Ambulatory Visit | Attending: Nurse Practitioner | Admitting: Nurse Practitioner

## 2021-01-18 ENCOUNTER — Ambulatory Visit: Payer: Self-pay | Attending: Internal Medicine

## 2021-01-18 DIAGNOSIS — Z23 Encounter for immunization: Secondary | ICD-10-CM

## 2021-01-18 DIAGNOSIS — E2839 Other primary ovarian failure: Secondary | ICD-10-CM

## 2021-01-18 NOTE — Progress Notes (Signed)
   Covid-19 Vaccination Clinic  Name:  Ianna Salmela    MRN: [id number] DOB: 10/21/1956  01/18/2021  Ms. Takach was observed post Covid-19 immunization for 15 minutes without incident. She was provided with Vaccine Information Sheet and instruction to access the V-Safe system.   Ms. Halle was instructed to call 911 with any severe reactions post vaccine: Marland Kitchen Difficulty breathing  . Swelling of face and throat  . A fast heartbeat  . A bad rash all over body  . Dizziness and weakness   Immunizations Administered    Name Date Dose VIS Date Route   PFIZER Comrnaty(Gray TOP) Covid-19 Vaccine 01/18/2021  3:49 PM 0.3 mL 09/16/2020 Intramuscular   Manufacturer: [employer], [employer]   Lot: [id number]   NDC: [id number]

## 2021-01-20 ENCOUNTER — Other Ambulatory Visit (HOSPITAL_COMMUNITY): Payer: Self-pay

## 2021-01-20 MED ORDER — ALENDRONATE SODIUM 35 MG PO TABS
ORAL_TABLET | ORAL | 3 refills | Status: AC
  Filled 2021-01-20: qty 12, 84d supply, fill #0

## 2021-01-20 MED ORDER — SERTRALINE HCL 50 MG PO TABS
ORAL_TABLET | ORAL | 0 refills | Status: DC
  Filled 2021-01-20 – 2021-01-25 (×2): qty 90, 90d supply, fill #0

## 2021-01-21 ENCOUNTER — Other Ambulatory Visit (HOSPITAL_COMMUNITY): Payer: Self-pay

## 2021-01-21 ENCOUNTER — Other Ambulatory Visit (HOSPITAL_BASED_OUTPATIENT_CLINIC_OR_DEPARTMENT_OTHER): Payer: Self-pay

## 2021-01-21 MED ORDER — COVID-19 MRNA VACCINE (PFIZER) 30 MCG/0.3ML IM SUSP
INTRAMUSCULAR | 0 refills | Status: AC
  Filled 2021-01-21: qty 0.3, 1d supply, fill #0

## 2021-01-24 ENCOUNTER — Other Ambulatory Visit (HOSPITAL_COMMUNITY): Payer: Self-pay

## 2021-01-25 ENCOUNTER — Other Ambulatory Visit (HOSPITAL_COMMUNITY): Payer: Self-pay

## 2021-01-29 ENCOUNTER — Other Ambulatory Visit (HOSPITAL_COMMUNITY): Payer: Self-pay

## 2021-02-01 LAB — EXTERNAL GENERIC LAB PROCEDURE: COLOGUARD: NEGATIVE

## 2021-02-28 ENCOUNTER — Other Ambulatory Visit (HOSPITAL_COMMUNITY): Payer: Self-pay

## 2021-02-28 MED FILL — Estradiol Vaginal Cream 0.1 MG/GM: VAGINAL | 90 days supply | Qty: 127.5 | Fill #0 | Status: CN

## 2021-03-02 ENCOUNTER — Other Ambulatory Visit (HOSPITAL_COMMUNITY): Payer: Self-pay

## 2021-03-24 ENCOUNTER — Other Ambulatory Visit (HOSPITAL_COMMUNITY): Payer: Self-pay

## 2021-05-11 ENCOUNTER — Other Ambulatory Visit (HOSPITAL_COMMUNITY): Payer: Self-pay

## 2021-05-16 ENCOUNTER — Other Ambulatory Visit (HOSPITAL_COMMUNITY): Payer: Self-pay

## 2021-05-16 MED ORDER — ERYTHROMYCIN 5 MG/GM OP OINT
TOPICAL_OINTMENT | OPHTHALMIC | 0 refills | Status: AC
  Filled 2021-05-16: qty 3.5, 10d supply, fill #0

## 2021-07-13 ENCOUNTER — Other Ambulatory Visit (HOSPITAL_COMMUNITY): Payer: Self-pay

## 2021-07-26 ENCOUNTER — Other Ambulatory Visit (HOSPITAL_COMMUNITY): Payer: Self-pay

## 2021-07-26 MED ORDER — SERTRALINE HCL 50 MG PO TABS
ORAL_TABLET | ORAL | 2 refills | Status: AC
  Filled 2021-07-26: qty 90, 90d supply, fill #0

## 2021-07-27 ENCOUNTER — Other Ambulatory Visit (HOSPITAL_COMMUNITY): Payer: Self-pay

## 2021-07-29 ENCOUNTER — Other Ambulatory Visit (HOSPITAL_COMMUNITY): Payer: Self-pay

## 2021-07-29 MED ORDER — AVENOVA 0.01 % EX SOLN
CUTANEOUS | 5 refills | Status: AC
  Filled 2021-07-29: qty 40, 30d supply, fill #0

## 2022-01-24 ENCOUNTER — Other Ambulatory Visit: Payer: Self-pay | Admitting: Nurse Practitioner

## 2022-01-24 DIAGNOSIS — Z1231 Encounter for screening mammogram for malignant neoplasm of breast: Secondary | ICD-10-CM

## 2022-01-27 ENCOUNTER — Ambulatory Visit
Admission: RE | Admit: 2022-01-27 | Discharge: 2022-01-27 | Disposition: A | Discharge status: Home / Self Care | Payer: Medicare HMO | Source: Ambulatory Visit | Attending: Nurse Practitioner | Admitting: Nurse Practitioner

## 2022-01-27 DIAGNOSIS — Z1231 Encounter for screening mammogram for malignant neoplasm of breast: Secondary | ICD-10-CM

## 2022-12-29 ENCOUNTER — Other Ambulatory Visit: Payer: Self-pay | Admitting: Nurse Practitioner

## 2022-12-29 DIAGNOSIS — Z1231 Encounter for screening mammogram for malignant neoplasm of breast: Secondary | ICD-10-CM

## 2023-02-13 ENCOUNTER — Ambulatory Visit: Payer: Medicare HMO

## 2023-03-14 ENCOUNTER — Ambulatory Visit: Payer: Medicare HMO

## 2023-05-17 ENCOUNTER — Other Ambulatory Visit (HOSPITAL_COMMUNITY): Payer: Self-pay

## 2023-05-17 MED ORDER — METHYLPREDNISOLONE 4 MG PO TBPK
ORAL_TABLET | ORAL | 0 refills | Status: AC
  Filled 2023-05-17: qty 21, 6d supply, fill #0

## 2023-05-17 MED ORDER — METHOCARBAMOL 500 MG PO TABS
500.0000 mg | ORAL_TABLET | Freq: Four times a day (QID) | ORAL | 1 refills | Status: DC
  Filled 2023-05-17: qty 40, 10d supply, fill #0

## 2023-05-18 ENCOUNTER — Other Ambulatory Visit (HOSPITAL_COMMUNITY): Payer: Self-pay

## 2023-05-20 ENCOUNTER — Emergency Department (HOSPITAL_BASED_OUTPATIENT_CLINIC_OR_DEPARTMENT_OTHER)
Admission: EM | Admit: 2023-05-20 | Discharge: 2023-05-20 | Disposition: A | Discharge status: Home / Self Care | Payer: Medicare HMO | Source: Home / Self Care | Attending: Emergency Medicine | Admitting: Emergency Medicine

## 2023-05-20 ENCOUNTER — Encounter (HOSPITAL_BASED_OUTPATIENT_CLINIC_OR_DEPARTMENT_OTHER): Payer: Self-pay | Admitting: Emergency Medicine

## 2023-05-20 ENCOUNTER — Other Ambulatory Visit: Payer: Self-pay

## 2023-05-20 DIAGNOSIS — S39012A Strain of muscle, fascia and tendon of lower back, initial encounter: Secondary | ICD-10-CM | POA: Diagnosis not present

## 2023-05-20 DIAGNOSIS — M545 Low back pain, unspecified: Secondary | ICD-10-CM | POA: Diagnosis present

## 2023-05-20 DIAGNOSIS — X500XXA Overexertion from strenuous movement or load, initial encounter: Secondary | ICD-10-CM | POA: Insufficient documentation

## 2023-05-20 MED ORDER — TIZANIDINE HCL 4 MG PO CAPS: 4.0000 mg | ORAL_CAPSULE | Freq: Three times a day (TID) | ORAL | 0 refills | Status: AC

## 2023-05-20 MED ORDER — ACETAMINOPHEN 500 MG PO TABS: 500.0000 mg | ORAL_TABLET | Freq: Four times a day (QID) | ORAL | 0 refills | Status: AC | PRN

## 2023-05-20 MED ORDER — OXYCODONE-ACETAMINOPHEN 5-325 MG PO TABS: 1.0000 | ORAL_TABLET | Freq: Two times a day (BID) | ORAL | 0 refills | Status: AC | PRN

## 2023-05-20 MED ORDER — MORPHINE SULFATE (PF) 4 MG/ML IV SOLN
4.0000 mg | Freq: Once | INTRAVENOUS | Status: AC
  Administered 2023-05-20: 4 mg via INTRAVENOUS
  Filled 2023-05-20: qty 1

## 2023-05-20 MED ORDER — LIDOCAINE 5 % EX PTCH
1.0000 | MEDICATED_PATCH | CUTANEOUS | Status: DC
  Administered 2023-05-20: 1 via TRANSDERMAL
  Filled 2023-05-20: qty 1

## 2023-05-20 MED ORDER — KETOROLAC TROMETHAMINE 30 MG/ML IJ SOLN
15.0000 mg | Freq: Once | INTRAMUSCULAR | Status: AC
  Administered 2023-05-20: 15 mg via INTRAVENOUS
  Filled 2023-05-20: qty 1

## 2023-05-20 MED ORDER — LIDOCAINE 4 % EX PTCH: 1.0000 | MEDICATED_PATCH | Freq: Two times a day (BID) | CUTANEOUS | 0 refills | Status: AC

## 2023-05-20 MED ORDER — IBUPROFEN 400 MG PO TABS: 400.0000 mg | ORAL_TABLET | Freq: Four times a day (QID) | ORAL | 0 refills | Status: AC | PRN

## 2023-05-20 MED ORDER — DIAZEPAM 2 MG PO TABS
2.0000 mg | ORAL_TABLET | Freq: Once | ORAL | Status: AC
  Administered 2023-05-20: 2 mg via ORAL
  Filled 2023-05-20: qty 1

## 2023-05-20 NOTE — ED Triage Notes (Signed)
Pt arrives to ED with c/o lower back pain with radiation down her left leg x4 days. Pt notes saw [facility] who started her on prednisone and a muscle relaxer. Pt notes no relief.

## 2023-05-20 NOTE — ED Provider Notes (Signed)
Megan Mendez EMERGENCY DEPARTMENT AT [facility] Provider Note   CSN: [id number] Arrival date & time: 05/20/23  3086     History  Chief Complaint  Patient presents with   Hip Pain   Back Pain    Megan Mendez is a 72 y.o. female.  HPI    72 year old female comes in with chief complaint of low back pain.  Patient started having pain about 4 days ago.  She had done some chores that involve moving and lifting things.  The pain is located in the lower back region and radiates down her left leg.  She had gone to [facility] and she was started on prednisone and a muscle relaxant.  She has not had any relief with the medications.  Her symptoms are worse when she is sitting for longer duration. Pt has no associated numbness, weakness, urinary incontinence, urinary retention, bowel incontinence, pins and needle sensation in the perineal area. She has taken over-the-counter medications without any relief.  Home Medications Prior to Admission medications   Medication Sig Start Date End Date Taking? Authorizing Provider  acetaminophen (TYLENOL) 500 MG tablet Take 1 tablet (500 mg total) by mouth every 6 (six) hours as needed. 05/20/23  Yes Derwood Kaplan, MD  ibuprofen (ADVIL) 400 MG tablet Take 1 tablet (400 mg total) by mouth every 6 (six) hours as needed. 05/20/23  Yes Lawayne Hartig, MD  lidocaine 4 % Place 1 patch onto the skin 2 (two) times daily. 05/20/23  Yes Derwood Kaplan, MD  methylPREDNISolone (MEDROL) 4 MG TBPK tablet Take as directed on package. 05/17/23  Yes   oxyCODONE-acetaminophen (PERCOCET/ROXICET) 5-325 MG tablet Take 1 tablet by mouth every 12 (twelve) hours as needed for severe pain. 05/20/23  Yes Derwood Kaplan, MD  tiZANidine (ZANAFLEX) 4 MG capsule Take 1 capsule (4 mg total) by mouth 3 (three) times daily. 05/20/23  Yes Derwood Kaplan, MD  traZODone (DESYREL) 50 MG tablet TAKE 1/2 TO 2 TABLETS BY MOUTH AT BEDTIME AS NEEDED FOR SLEEP 08/06/20 05/20/23 Yes  Lance Bosch, NP  alendronate (FOSAMAX) 35 MG tablet Take 1 tablet every week on an empty stomach 30 min before first food or drink for bone strength 01/20/21     cetirizine (ZYRTEC) 10 MG tablet Take 10 mg by mouth daily.    [provider]  COVID-19 mRNA vaccine, Pfizer, 30 MCG/0.3ML injection Inject into the muscle. 01/18/21   Judyann Munson, MD  erythromycin ophthalmic ointment Use 1 cm of ointment up to 6 times a day for 7-10 days. 05/16/21     Eyelid Cleansers (AVENOVA) 0.01 % SOLN Use 1 spray 2 times a day as directed 07/29/21     metroNIDAZOLE (METROGEL) 0.75 % gel Apply 1 application topically 2 (two) times daily.    [provider]  sertraline (ZOLOFT) 50 MG tablet Take 1/2 tablet by mouth every day for 1 week then increase to 1 tablet by mouth every day. 07/26/21     sertraline (ZOLOFT) 50 MG tablet TAKE 1/2 TABLET BY MOUTH FOR 1 WEEK THEN TAKE 1 TABLET ONCE A DAY 11/22/20 11/22/21  Lance Bosch, NP      Allergies    Patient has no known allergies.    Review of Systems   Review of Systems  All other systems reviewed and are negative.   Physical Exam Updated Vital Signs BP (!) 119/58 (BP Location: Left Arm)   Pulse (!) 51   Temp 97.9 F (36.6 C) (Oral)   Resp 18  Ht 5\' 3"  (1.6 m)   Wt 72.6 kg   SpO2 98%   BMI 28.34 kg/m  Physical Exam Vitals and nursing note reviewed.  Constitutional:      Appearance: She is well-developed.  HENT:     Head: Atraumatic.     Mouth/Throat:     Mouth: Mucous membranes are dry.  Cardiovascular:     Rate and Rhythm: Normal rate.  Pulmonary:     Effort: Pulmonary effort is normal.  Musculoskeletal:     Cervical back: Normal range of motion and neck supple.     Comments: Patient has tenderness with palpation over the L5-S1 region.  Skin:    General: Skin is warm and dry.  Neurological:     Mental Status: She is alert and oriented to person, place, and time.     Cranial Nerves: No cranial nerve deficit.      Sensory: No sensory deficit.     Motor: No weakness.     Coordination: Coordination normal.     ED Results / Procedures / Treatments   Labs (all labs ordered are listed, but only abnormal results are displayed) Labs Reviewed - No data to display  EKG None  Radiology No results found.  Procedures Procedures    Medications Ordered in ED Medications  lidocaine (LIDODERM) 5 % 1 patch (1 patch Transdermal Patch Applied 05/20/23 0854)  morphine (PF) 4 MG/ML injection 4 mg (4 mg Intravenous Given 05/20/23 0854)  ketorolac (TORADOL) 30 MG/ML injection 15 mg (15 mg Intravenous Given 05/20/23 0853)  diazepam (VALIUM) tablet 2 mg (2 mg Oral Given 05/20/23 0854)    ED Course/ Medical Decision Making/ A&P Clinical Course as of 05/20/23 1027  Sun May 20, 2023  1026 Patient reassessed.  She is feeling a lot better.  She is currently symptom-free.  She is requesting that we do give her lidocaine patch, as it helped.  Family at the bedside, they are supportive.  Discussed with the patient that she will have to take some ibuprofen and Tylenol around-the-clock to maintain the pain level and she can take stronger pain medicine only as needed.  In place of Robaxin, which has not helped patient at all we are giving her Zanaflex. [AN]    Clinical Course User Index [AN] Derwood Kaplan, MD                                 Medical Decision Making Risk OTC drugs. Prescription drug management.   72 year old female comes in with chief complaint of back pain with radiation of pain going down the left leg.  Symptoms have been present now for 4 days.  No red flags for cord compression or cauda equina.  Differential diagnosis considered for this patient includes - DJD of the back - Spondylitises/ spondylosis - Sciatica -- Lytic/pathologic fracture - Myelitis - Musculoskeletal pain  Patient indicates that she had an x-ray done at [facility].  I cannot see the x-ray, but I do not see utility  in repeating x-ray for the type of symptoms she is having.  Suspicion is low for lytic lesions, suspect the outpatient team to pick it up if they were discovered.  She is also supposed to get follow-up with [facility] next week, therefore we will avoid unnecessary x-ray at this time.  Plan will be to focus on symptom management.  Final Clinical Impression(s) / ED Diagnoses Final diagnoses:  Lumbar strain, initial encounter  Rx / DC Orders ED Discharge Orders          Ordered    lidocaine 4 %  2 times daily        05/20/23 1018    tiZANidine (ZANAFLEX) 4 MG capsule  3 times daily        05/20/23 1018    acetaminophen (TYLENOL) 500 MG tablet  Every 6 hours PRN        05/20/23 1018    ibuprofen (ADVIL) 400 MG tablet  Every 6 hours PRN        05/20/23 1018    oxyCODONE-acetaminophen (PERCOCET/ROXICET) 5-325 MG tablet  Every 12 hours PRN        05/20/23 1019              Derwood Kaplan, MD 05/20/23 1027

## 2023-05-20 NOTE — Discharge Instructions (Addendum)
We saw you in the ER for back pain. Fortunately, our evaluation is not concerning for emergent pathology such as spinal cord compression or infection.   Often the pain is self limiting, you just need time and supportive medications such as ibuprofen and Tylenol every 6-8 hours for the next few days. Take the muscle relaxant as needed, and see your primary care doctor for further management if the symptoms continue to linger.   Please use the back exercises to strengthen the back muscles and be very careful with activities in future to prevent similar painful events.  Please return to the ER if your pain becomes excruciating or you start developing new numbness, weakness, urinary incontinence (peeing on self without warning), urinary retention (not able to pee despite bladder feeling full), inability to defecate, pins and needles sensation by your ano-genital area.   Pain medicine regimen: 8 AM in the morning take both ibuprofen and Tylenol. Noon -take only Tylenol 2 PM -take only ibuprofen 5 PM - Take only Tylenol 8 pm - Take only ibuprofen 10 PM - take only Tylenol You may also just combine skip 8 PM ibuprofen and take both Tylenol and ibuprofen at 10 PM. Muscle relaxants and pain medication should be taken with caution to prevent any falls. Use those medications sparingly and only if the pain is excruciating.

## 2023-07-11 ENCOUNTER — Other Ambulatory Visit: Payer: Self-pay | Admitting: Family Medicine

## 2023-07-11 ENCOUNTER — Ambulatory Visit
Admission: RE | Admit: 2023-07-11 | Discharge: 2023-07-11 | Disposition: A | Discharge status: Home / Self Care | Payer: Medicare HMO | Source: Ambulatory Visit | Attending: Nurse Practitioner | Admitting: Nurse Practitioner

## 2023-07-11 DIAGNOSIS — M8580 Other specified disorders of bone density and structure, unspecified site: Secondary | ICD-10-CM

## 2023-07-11 DIAGNOSIS — Z1231 Encounter for screening mammogram for malignant neoplasm of breast: Secondary | ICD-10-CM

## 2023-07-16 ENCOUNTER — Other Ambulatory Visit: Payer: Self-pay | Admitting: Nurse Practitioner

## 2023-07-16 DIAGNOSIS — R928 Other abnormal and inconclusive findings on diagnostic imaging of breast: Secondary | ICD-10-CM

## 2023-07-25 ENCOUNTER — Ambulatory Visit: Admission: RE | Admit: 2023-07-25 | Payer: Medicare HMO | Source: Ambulatory Visit

## 2023-07-25 ENCOUNTER — Ambulatory Visit
Admission: RE | Admit: 2023-07-25 | Discharge: 2023-07-25 | Disposition: A | Discharge status: Home / Self Care | Payer: Medicare HMO | Source: Ambulatory Visit | Attending: Nurse Practitioner | Admitting: Nurse Practitioner

## 2023-07-25 DIAGNOSIS — R928 Other abnormal and inconclusive findings on diagnostic imaging of breast: Secondary | ICD-10-CM

## 2024-01-30 IMAGING — MG MM DIGITAL SCREENING BILAT W/ TOMO AND CAD
8 series · 8 of 24 positions shown · non-contrast
Comparison: Previous exam(s).

CLINICAL DATA: Screening.

EXAM:
DIGITAL SCREENING BILATERAL MAMMOGRAM WITH TOMOSYNTHESIS AND CAD
TECHNIQUE: Bilateral screening digital craniocaudal and mediolateral oblique
mammograms were obtained. Bilateral screening digital breast
tomosynthesis was performed. The images were evaluated with
computer-aided detection.

[L CC synth-2D]
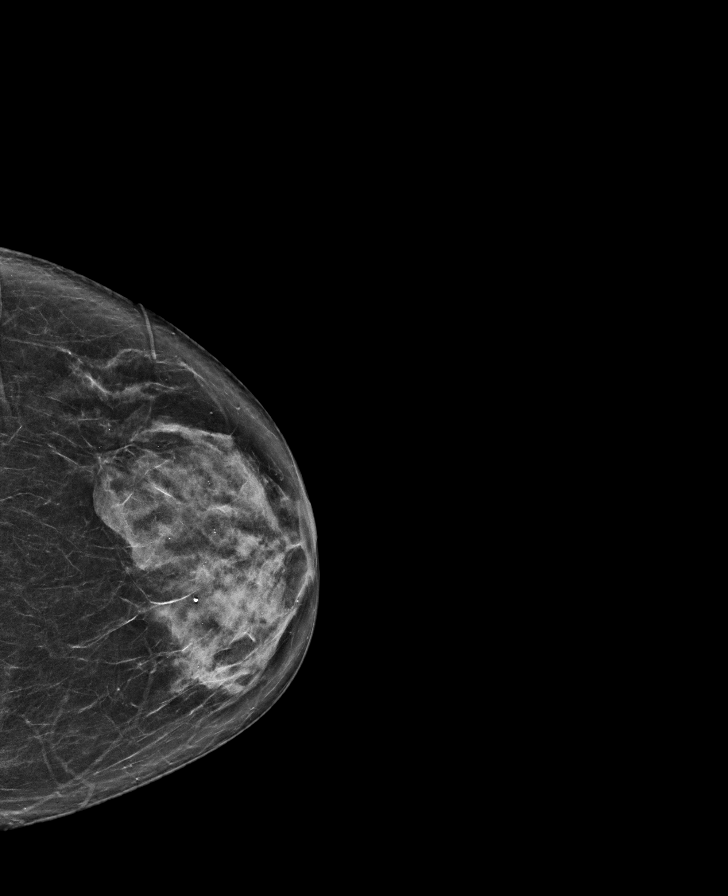

[R MLO synth-2D]
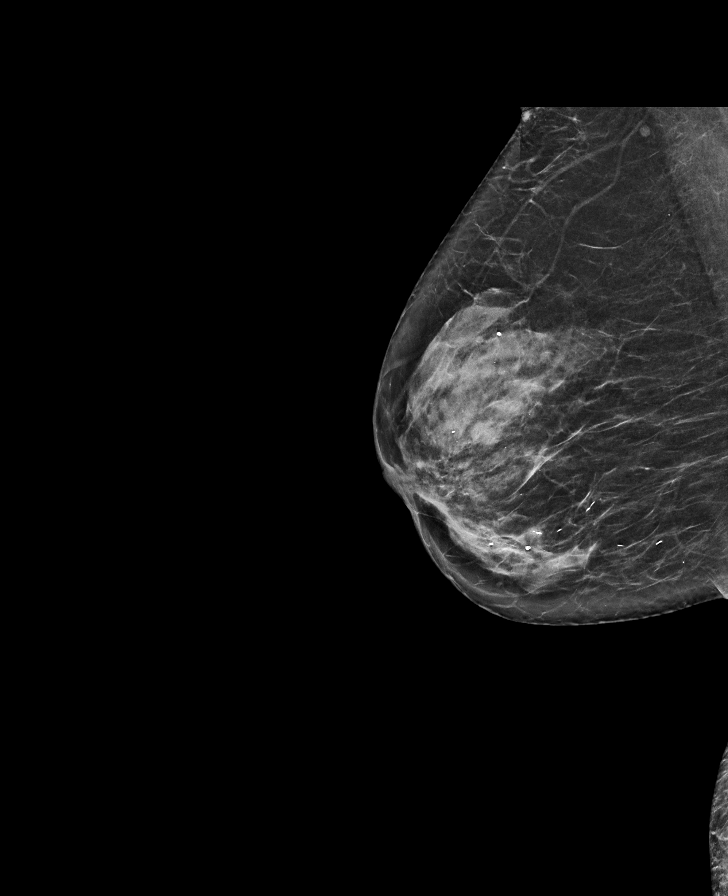

[R CC synth-2D]
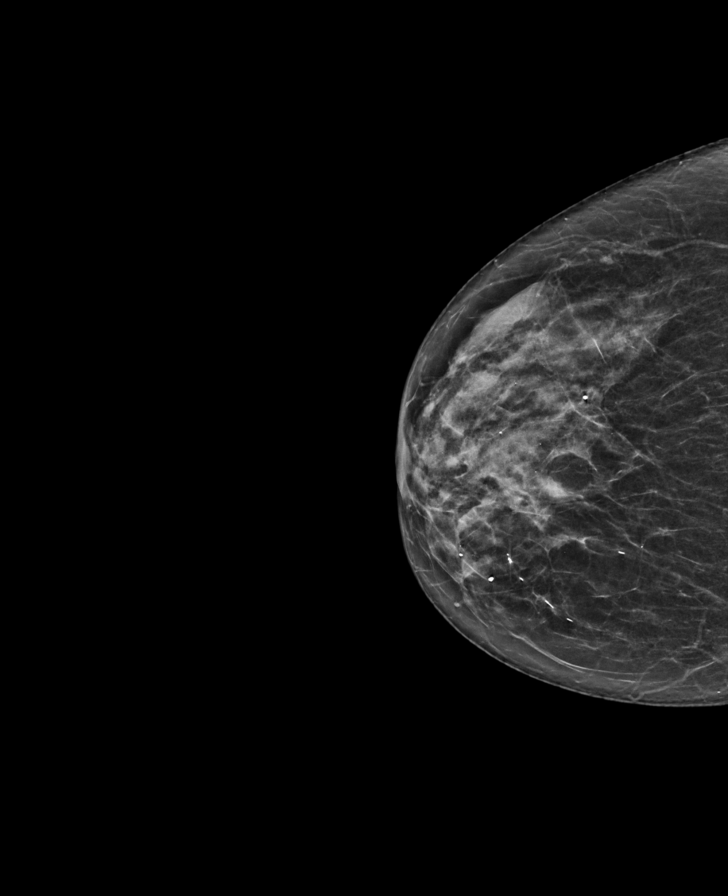

[L MLO synth-2D]
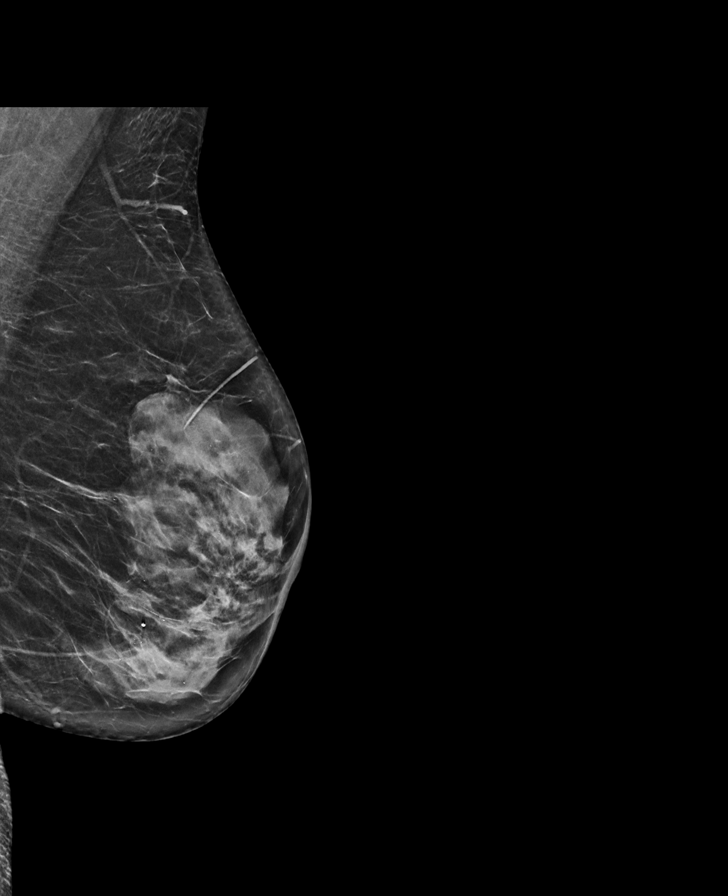

[R MLO tomo · tomo slice 33/66.0]
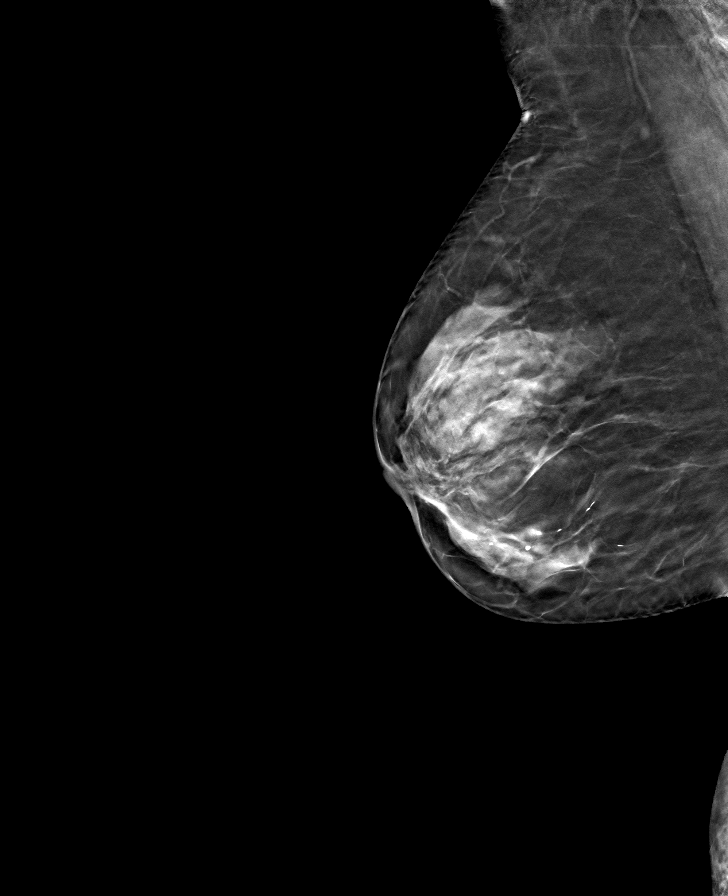

[R CC tomo · tomo slice 30/59.0]
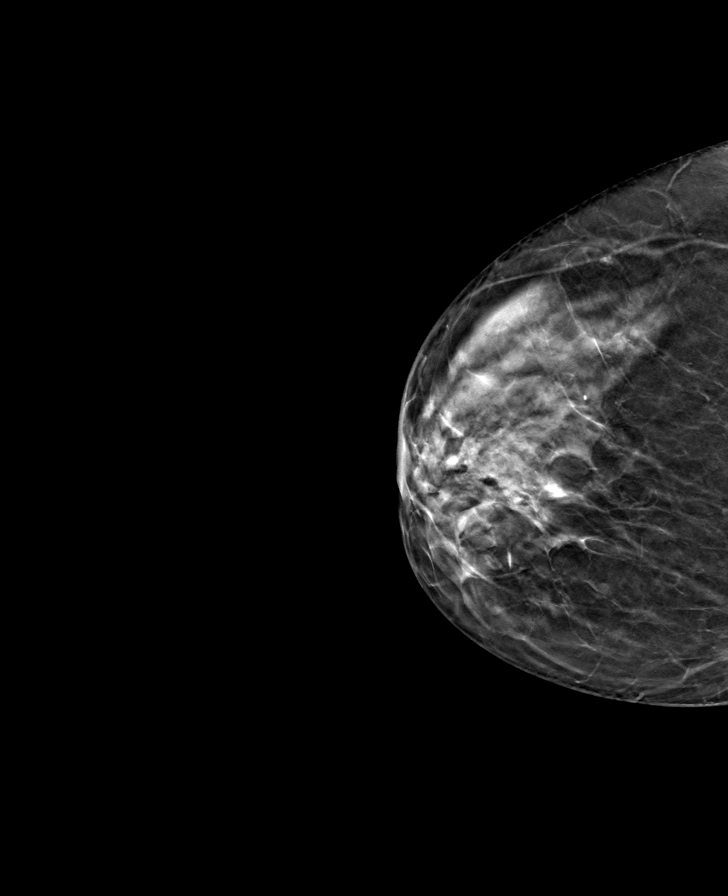

[L MLO tomo · tomo slice 33/64.0]
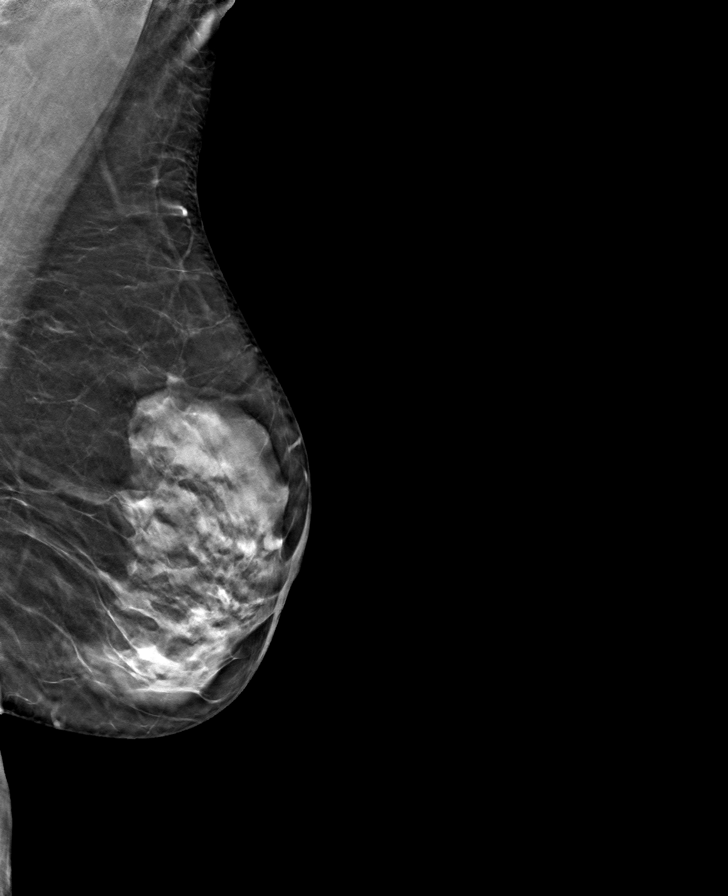

[L CC tomo · tomo slice 33/65.0]
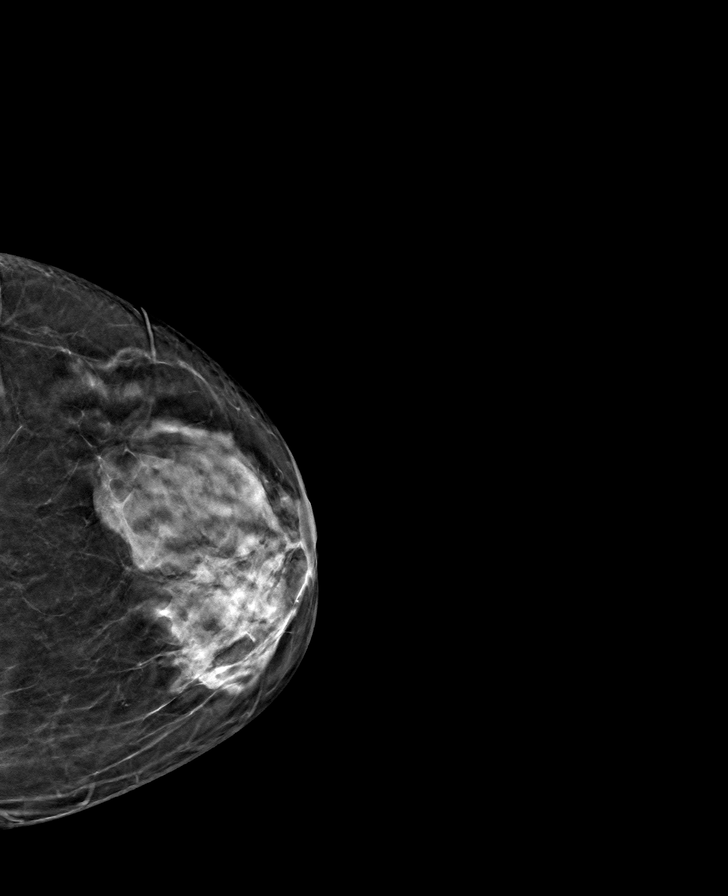

[8 of 24 positions shown; findings below may reference images not displayed]

ACR Breast Density Category c: The breast tissue is heterogeneously
dense, which may obscure small masses.
FINDINGS: There are no findings suspicious for malignancy.
IMPRESSION: No mammographic evidence of malignancy. A result letter of this
screening mammogram will be mailed directly to the patient.

RECOMMENDATION:
Screening mammogram in one year. (Code:Q3-W-BC3)

BI-RADS CATEGORY  1: Negative.

## 2024-02-07 ENCOUNTER — Ambulatory Visit
Admission: RE | Admit: 2024-02-07 | Discharge: 2024-02-07 | Disposition: A | Discharge status: Home / Self Care | Payer: Medicare HMO | Source: Ambulatory Visit | Attending: Family Medicine | Admitting: Family Medicine

## 2024-02-07 DIAGNOSIS — M8580 Other specified disorders of bone density and structure, unspecified site: Secondary | ICD-10-CM
